# Patient Record
Sex: Male | Born: 1990 | Race: Black or African American | Hispanic: No | Marital: Married | Smoking: Current every day smoker
Health system: Southern US, Community
[De-identification: ages and names within clinical notes are randomized; demographics above are authoritative.]

## PROBLEM LIST (undated history)

## (undated) DIAGNOSIS — J45909 Unspecified asthma, uncomplicated: Secondary | ICD-10-CM

## (undated) HISTORY — PX: NO PAST SURGERIES: SHX2092

## (undated) HISTORY — DX: Unspecified asthma, uncomplicated: J45.909

---

## 2018-10-05 ENCOUNTER — Encounter (HOSPITAL_COMMUNITY): Payer: Self-pay | Admitting: *Deleted

## 2018-10-05 ENCOUNTER — Emergency Department (HOSPITAL_COMMUNITY): Payer: No Typology Code available for payment source

## 2018-10-05 ENCOUNTER — Other Ambulatory Visit: Payer: Self-pay

## 2018-10-05 ENCOUNTER — Emergency Department (HOSPITAL_COMMUNITY)
Admission: EM | Admit: 2018-10-05 | Discharge: 2018-10-05 | Disposition: A | Discharge status: Home / Self Care | Payer: No Typology Code available for payment source | Attending: Emergency Medicine | Admitting: Emergency Medicine

## 2018-10-05 DIAGNOSIS — F17210 Nicotine dependence, cigarettes, uncomplicated: Secondary | ICD-10-CM | POA: Insufficient documentation

## 2018-10-05 DIAGNOSIS — M545 Low back pain, unspecified: Secondary | ICD-10-CM

## 2018-10-05 MED ORDER — METHOCARBAMOL 750 MG PO TABS: 750.0000 mg | ORAL_TABLET | Freq: Three times a day (TID) | ORAL | 0 refills | Status: DC | PRN

## 2018-10-05 NOTE — Discharge Instructions (Addendum)
The x-rays on your lower back did not show any broken bones.  Please take Ibuprofen (Advil, motrin) and Tylenol (acetaminophen) to relieve your pain.  You may take up to 600 MG (3 pills) of normal strength ibuprofen every 8 hours as needed.  In between doses of ibuprofen you make take tylenol, up to 1,000 mg (two extra strength pills).  Do not take more than 3,000 mg tylenol in a 24 hour period.  Please check all medication labels as many medications such as pain and cold medications may contain tylenol.  Do not drink alcohol while taking these medications.  Do not take other NSAID'S while taking ibuprofen (such as aleve or naproxen).  Please take ibuprofen with food to decrease stomach upset.  You are being prescribed a medication which may make you sleepy. For 24 hours after one dose please do not drive, operate heavy machinery, care for a small child with out another adult present, or perform any activities that may cause harm to you or someone else if you were to fall asleep or be impaired.   The best way to get rid of muscle pain is by taking NSAIDS, using heat, massage therapy, and gentle stretching/range of motion exercises.
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
## 2018-10-05 NOTE — ED Notes (Signed)
Bed: WA20 Expected date:  Expected time:  Means of arrival:  Comments: 
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
## 2019-12-13 ENCOUNTER — Ambulatory Visit
Admission: EM | Admit: 2019-12-13 | Discharge: 2019-12-13 | Disposition: A | Discharge status: Home / Self Care | Payer: Self-pay | Attending: Family Medicine | Admitting: Family Medicine

## 2019-12-13 ENCOUNTER — Encounter: Payer: Self-pay | Admitting: Emergency Medicine

## 2019-12-13 ENCOUNTER — Other Ambulatory Visit: Payer: Self-pay

## 2019-12-13 DIAGNOSIS — R05 Cough: Secondary | ICD-10-CM | POA: Insufficient documentation

## 2019-12-13 DIAGNOSIS — B356 Tinea cruris: Secondary | ICD-10-CM | POA: Insufficient documentation

## 2019-12-13 DIAGNOSIS — F17210 Nicotine dependence, cigarettes, uncomplicated: Secondary | ICD-10-CM | POA: Insufficient documentation

## 2019-12-13 DIAGNOSIS — Z20822 Contact with and (suspected) exposure to covid-19: Secondary | ICD-10-CM | POA: Insufficient documentation

## 2019-12-13 DIAGNOSIS — J029 Acute pharyngitis, unspecified: Secondary | ICD-10-CM | POA: Insufficient documentation

## 2019-12-13 DIAGNOSIS — Z79899 Other long term (current) drug therapy: Secondary | ICD-10-CM | POA: Insufficient documentation

## 2019-12-13 DIAGNOSIS — R062 Wheezing: Secondary | ICD-10-CM | POA: Insufficient documentation

## 2019-12-13 DIAGNOSIS — J069 Acute upper respiratory infection, unspecified: Secondary | ICD-10-CM | POA: Insufficient documentation

## 2019-12-13 LAB — CHLAMYDIA/NGC RT PCR (ARMC ONLY)
Chlamydia Tr: NOT DETECTED
N gonorrhoeae: NOT DETECTED

## 2019-12-13 LAB — GROUP A STREP BY PCR: Group A Strep by PCR: NOT DETECTED

## 2019-12-13 MED ORDER — ALBUTEROL SULFATE HFA 108 (90 BASE) MCG/ACT IN AERS: 1.0000 | INHALATION_SPRAY | Freq: Four times a day (QID) | RESPIRATORY_TRACT | 0 refills | Status: DC | PRN

## 2019-12-13 MED ORDER — FLUCONAZOLE 150 MG PO TABS: ORAL_TABLET | ORAL | 0 refills | Status: DC

## 2019-12-13 NOTE — ED Provider Notes (Signed)
[facility]    CSN: [id number] Arrival date & time: 12/13/19  1417      History   Chief Complaint Chief Complaint  Patient presents with  . Cough  . Wheezing  . SEXUALLY TRANSMITTED DISEASE  . Sore Throat    HPI Jorge Wolfe is a 29 y.o. male.   29 yo male with a c/o cough, wheezing and nasal congestion since yesterday. Denies any fevers, chills, chest pains, shortness of breath.   Also c/o itchy rash to his right scrotum. Denies any discharge. Patient requesting STD testing.    Cough Associated symptoms: wheezing   Wheezing Associated symptoms: cough   Sore Throat    History reviewed. No pertinent past medical history.  There are no problems to display for this patient.   Past Surgical History:  Procedure Laterality Date  . NO PAST SURGERIES         Home Medications    Prior to Admission medications   Medication Sig Start Date End Date Taking? Authorizing Provider  albuterol (VENTOLIN HFA) 108 (90 Base) MCG/ACT inhaler Inhale 1-2 puffs into the lungs every 6 (six) hours as needed for wheezing or shortness of breath. 12/13/19   Payton Mccallum, MD  fluconazole (DIFLUCAN) 150 MG tablet 1 tablet po once, then repeat in 1 week 12/13/19   Payton Mccallum, MD  methocarbamol (ROBAXIN) 750 MG tablet Take 1-2 tablets (750-1,500 mg total) by mouth 3 (three) times daily as needed for muscle spasms. 10/05/18   Cristina Gong, PA-C    Family History Family History  Problem Relation Age of Onset  . Hypertension Mother   . Arrhythmia Father   . Hypertension Father   . Hyperlipidemia Father     Social History Social History   Tobacco Use  . Smoking status: Current Every Day Smoker    Packs/day: 0.00    Years: 8.00    Pack years: 0.00    Types: Cigars  . Smokeless tobacco: Never Used  . Tobacco comment: 2 cigars per day  Substance Use Topics  . Alcohol use: Yes    Alcohol/week: 6.0 standard drinks    Types: 6 Standard drinks or equivalent per  week    Comment: beer and liquor  . Drug use: Never     Allergies   Patient has no known allergies.   Review of Systems Review of Systems  Respiratory: Positive for cough and wheezing.      Physical Exam Triage Vital Signs ED Triage Vitals  Enc Vitals Group     BP 12/13/19 1429 131/82     Pulse Rate 12/13/19 1429 93     Resp 12/13/19 1429 18     Temp 12/13/19 1429 99.7 F (37.6 C)     Temp Source 12/13/19 1429 Oral     SpO2 12/13/19 1429 97 %     Weight 12/13/19 1432 250 lb (113.4 kg)     Height 12/13/19 1432 5\' 9"  (1.753 m)     Head Circumference --      Peak Flow --      Pain Score 12/13/19 1430 6     Pain Loc --      Pain Edu? --      Excl. in GC? --    No data found.  Updated Vital Signs BP 131/82 (BP Location: Left Arm)   Pulse 93   Temp 99.7 F (37.6 C) (Oral)   Resp 18   Ht 5\' 9"  (1.753 m)   Wt 113.4  kg   SpO2 97%   BMI 36.92 kg/m   Visual Acuity Right Eye Distance:   Left Eye Distance:   Bilateral Distance:    Right Eye Near:   Left Eye Near:    Bilateral Near:     Physical Exam Vitals and nursing note reviewed.  Constitutional:      General: He is not in acute distress.    Appearance: He is not toxic-appearing or diaphoretic.  HENT:     Right Ear: Tympanic membrane normal.     Left Ear: Tympanic membrane normal.     Mouth/Throat:     Pharynx: Posterior oropharyngeal erythema present. No oropharyngeal exudate.  Cardiovascular:     Heart sounds: Normal heart sounds.  Pulmonary:     Effort: Pulmonary effort is normal. No respiratory distress.     Breath sounds: No stridor. Wheezing (few, mild) present. No rhonchi or rales.  Genitourinary:    Comments: Right scrotal erythematous, scaly rash Neurological:     Mental Status: He is alert.      UC Treatments / Results  Labs (all labs ordered are listed, but only abnormal results are displayed) Labs Reviewed  GROUP A STREP BY PCR  SARS CORONAVIRUS 2 (TAT 6-24 HRS)  CHLAMYDIA/NGC  RT PCR [facility] ONLY)    EKG   Radiology No results found.  Procedures Procedures (including critical care time)  Medications Ordered in UC Medications - No data to display  Initial Impression / Assessment and Plan / UC Course  I have reviewed the triage vital signs and the nursing notes.  Pertinent labs & imaging results that were available during my care of the patient were reviewed by me and considered in my medical decision making (see chart for details).     Final Clinical Impressions(s) / UC Diagnoses   Final diagnoses:  Viral URI with cough  Wheezing  Tinea cruris     Discharge Instructions     Over the counter antifungal ("jock itch") powder two times per day    ED Prescriptions    Medication Sig Dispense Auth. Provider   albuterol (VENTOLIN HFA) 108 (90 Base) MCG/ACT inhaler Inhale 1-2 puffs into the lungs every 6 (six) hours as needed for wheezing or shortness of breath. 8 g Norval Gable, MD   fluconazole (DIFLUCAN) 150 MG tablet 1 tablet po once, then repeat in 1 week 2 tablet Norval Gable, MD     1. diagnosis reviewed with patient 2. rx as per orders above; reviewed possible side effects, interactions, risks and benefits  3. Recommend supportive treatment as above 4. Follow-up prn if symptoms worsen or don't improve  PDMP not reviewed this encounter.   Norval Gable, MD 12/13/19 [phone]

## 2019-12-13 NOTE — Discharge Instructions (Signed)
Over the counter antifungal ("jock itch") powder two times per day

## 2019-12-13 NOTE — ED Triage Notes (Signed)
Patient also states that he has a sore throat.

## 2019-12-13 NOTE — ED Triage Notes (Signed)
Patient in today c/o cough and wheezing x 1 day. Patient denies fever or any other symptoms. Patient has not used any OTC medications.  Patient also requesting STD testing. He states he has itching in the scrotum area x 2 months. Patient denies penile discharge. Patient has used coconut oil to the scrotal area.

## 2019-12-14 LAB — SARS CORONAVIRUS 2 (TAT 6-24 HRS): SARS Coronavirus 2: NEGATIVE
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
## 2023-06-25 ENCOUNTER — Ambulatory Visit: Payer: BC Managed Care – PPO | Admitting: Nurse Practitioner

## 2023-06-25 ENCOUNTER — Encounter: Payer: Self-pay | Admitting: Nurse Practitioner

## 2023-06-25 VITALS — BP 120/80 | HR 78 | Temp 98.5°F | Ht 69.0 in | Wt 288.2 lb

## 2023-06-25 DIAGNOSIS — Z23 Encounter for immunization: Secondary | ICD-10-CM | POA: Diagnosis not present

## 2023-06-25 DIAGNOSIS — Z1159 Encounter for screening for other viral diseases: Secondary | ICD-10-CM | POA: Diagnosis not present

## 2023-06-25 DIAGNOSIS — Z13228 Encounter for screening for other metabolic disorders: Secondary | ICD-10-CM

## 2023-06-25 DIAGNOSIS — Z8709 Personal history of other diseases of the respiratory system: Secondary | ICD-10-CM | POA: Insufficient documentation

## 2023-06-25 DIAGNOSIS — Z113 Encounter for screening for infections with a predominantly sexual mode of transmission: Secondary | ICD-10-CM | POA: Diagnosis not present

## 2023-06-25 DIAGNOSIS — Z13220 Encounter for screening for lipoid disorders: Secondary | ICD-10-CM

## 2023-06-25 DIAGNOSIS — R062 Wheezing: Secondary | ICD-10-CM

## 2023-06-25 DIAGNOSIS — Z7689 Persons encountering health services in other specified circumstances: Secondary | ICD-10-CM

## 2023-06-25 DIAGNOSIS — R0683 Snoring: Secondary | ICD-10-CM

## 2023-06-25 DIAGNOSIS — Z8669 Personal history of other diseases of the nervous system and sense organs: Secondary | ICD-10-CM | POA: Insufficient documentation

## 2023-06-25 DIAGNOSIS — Z2821 Immunization not carried out because of patient refusal: Secondary | ICD-10-CM

## 2023-06-25 MED ORDER — ALBUTEROL SULFATE HFA 108 (90 BASE) MCG/ACT IN AERS: 1.0000 | INHALATION_SPRAY | Freq: Four times a day (QID) | RESPIRATORY_TRACT | 0 refills | Status: DC | PRN

## 2023-06-25 NOTE — Assessment & Plan Note (Signed)
Will check Hepatitis C screening due to recent recommendations to screen all adults 18 years and older

## 2023-06-25 NOTE — Progress Notes (Signed)
[name], CMA,acting as a Neurosurgeon for Jorge Felts, FNP.,have documented all relevant documentation on the behalf of Jorge Felts, FNP,as directed by  Jorge Felts, FNP while in the presence of Jorge Felts, FNP.  Subjective:  Patient ID: Jorge Wolfe , male    DOB: 1990/08/04 , 32 y.o.   MRN: [id number]  Chief Complaint  Patient presents with   Establish Care    HPI  Patient presents today to establish care. He works with towing and Retail banker. Married. No children.   PMH - Asthma as a child - uses an inhaler when needed.  Sometimes he is wheezing and shortness of breath. He was using an albuterol inhaler. He was using when he played sports as a child would use more.    Patient reports he believes he needs a CPAP machine, patient reports he had many sleep studies.  He was told he had sleep apnea.  He states doing a at home sleep study about a year ago. 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 He is unable to tell if he is having fatigue. He does report snoring.  He never received the report from the sleep study. [facility]  Patient reports he has skin blotches all over his shoulders, back, and chest. Patient states it is sometimes itchy but it is sometimes flaky and dry. He has not treated with any creams other than with lotion.      Past Medical History:  Diagnosis Date   Asthma      Family History  Problem Relation Age of Onset   Hypertension Mother    Arthritis Father    Heart disease Father    Arrhythmia Father    Hypertension Father    Hyperlipidemia Father      Current Outpatient Medications:    albuterol (VENTOLIN HFA) 108 (90 Base) MCG/ACT inhaler, Inhale 1-2 puffs into the lungs every 6 (six) hours as needed for wheezing or shortness of breath., Disp: 8 g, Rfl: 0   No Known Allergies   Review of Systems  Constitutional: Negative.    HENT:         Snoring  Respiratory: Negative.    Cardiovascular: Negative.   Gastrointestinal:  Negative for nausea.  Musculoskeletal: Negative.   Neurological: Negative.   Psychiatric/Behavioral: Negative.       Today's Vitals   06/25/23 1055  BP: 120/80  Pulse: 78  Temp: 98.5 F (36.9 C)  TempSrc: Oral  Height: 5\' 9"  (1.753 m)  PainSc: 0-No pain   Body mass index is 36.92 kg/m.  Wt Readings from Last 3 Encounters:  12/13/19 250 lb (113.4 kg)  10/05/18 235 lb (106.6 kg)    The ASCVD Risk score (Arnett DK, et al., 2019) failed to calculate for the following reasons:   The 2019 ASCVD risk score is only valid for ages 16 to 26  Objective:  Physical Exam Vitals reviewed.  Constitutional:      General: He is not in acute distress.    Appearance: Normal appearance. He is obese.  Cardiovascular:     Rate and Rhythm: Normal rate and regular rhythm.     Pulses: Normal pulses.     Heart sounds: Normal heart sounds. No murmur heard. Pulmonary:     Effort: Pulmonary effort is normal. No respiratory distress.     Breath sounds:  Normal breath sounds. No wheezing.  Skin:    General: Skin is warm and dry.     Capillary Refill: Capillary refill takes less than 2 seconds.  Neurological:     General: No focal deficit present.     Mental Status: He is alert and oriented to person, place, and time.     Cranial Nerves: No cranial nerve deficit.     Motor: No weakness.  Psychiatric:        Mood and Affect: Mood normal.        Behavior: Behavior normal.        Thought Content: Thought content normal.        Judgment: Judgment normal.         Assessment And Plan:  Establishing care with new doctor, encounter for Assessment & Plan: Patient is here to establish care. Went over patient medical, family, social and surgical history. Reviewed with patient their medications and any allergies  Reviewed with patient their sexual orientation, drug/tobacco and alcohol use Dicussed  any new concerns with patient  recommended patient comes in for a physical exam and complete blood work.  Educated patient about the importance of annual screenings and immunizations.  Advised patient to eat a healthy diet along with exercise for atleast 30-45 min atleast 4-5 days of the week.     Snoring Assessment & Plan: His wife showed a video of him snoring loudly, will refer to [facility] for sleep evaluation. He was to have a CPAP but did not get due to cost did not have insurance. Wife reports stopped breathing 60 times, I do not have access to his results. Discussed risk of sleep apnea to include cardiac events, hyperglycemia, hyperlipidemia and obesity  Orders: -     Ambulatory referral to Sleep Studies  Wheezing Assessment & Plan: No wheezing on physical exam however due to history of asthma as a child will send rx for albuterol if has to use more than 3 times a week needs to call to office for an appt.   Orders: -     Albuterol Sulfate HFA; Inhale 1-2 puffs into the lungs every 6 (six) hours as needed for wheezing or shortness of breath.  Dispense: 8 g; Refill: 0 -     DG Chest 2 View; Future  Need for influenza vaccination Assessment & Plan: Influenza vaccine administered Encouraged to take Tylenol as needed for fever or muscle aches.   Orders: -     Flu vaccine trivalent PF, 6mos and older(Flulaval,Afluria,Fluarix,Fluzone)  Need for Tdap vaccination Assessment & Plan: Will give tetanus vaccine today while in office. Refer to order management. TDAP will be administered to adults 74-10 years old every 10 years.   Orders: -     Tdap vaccine greater than or equal to 7yo IM  Screening for STDs (sexually transmitted diseases) -     HIV Antibody (routine testing w rflx) -     RPR -     HSV 1 and 2 Ab, IgG -     Chlamydia/Gonococcus/Trichomonas, NAA -     Hepatitis B surface antibody,qualitative -     T pallidum Screening [facility]  COVID-19 vaccination  declined Assessment & Plan: Declines covid 19 vaccine. Discussed risk of covid 14 and if he changes her mind about the vaccine to call the office. Education has been provided regarding the importance of this vaccine but patient still declined. Advised may receive this vaccine at local pharmacy or Health Dept.or vaccine clinic. Aware to provide  a copy of the vaccination record if obtained from local pharmacy or Health Dept.  Encouraged to take multivitamin, vitamin d, vitamin c and zinc to increase immune system. Aware can call office if would like to have vaccine here at office. Verbalized acceptance and understanding.    Encounter for hepatitis C screening test for low risk patient Assessment & Plan: Will check Hepatitis C screening due to recent recommendations to screen all adults 18 years and older   Orders: -     Hepatitis C antibody  History of sleep apnea Assessment & Plan: We will start fresh as he did a home sleep study that he paid out of pocket and does not have the results. Will refer to [facility] for evaluation. Wife reports he stopped breathing 60 times.    Orders: -     TSH  Encounter for screening for metabolic disorder -     CMP14 + Anion Gap -     Hemoglobin A1c  Encounter for screening for lipid disorder -     Lipid panel  History of asthma Assessment & Plan: Per patient diagnosed as a child    Return for labs in am and HM in February 2025.  Patient was given opportunity to ask questions. Patient verbalized understanding of the plan and was able to repeat key elements of the plan. All questions were answered to their satisfaction.    Jeanell Sparrow, FNP, have reviewed all documentation for this visit. The documentation on 06/25/23 for the exam, diagnosis, procedures, and orders are all accurate and complete.   IF YOU HAVE BEEN REFERRED TO A SPECIALIST, IT MAY TAKE 1-2 WEEKS TO SCHEDULE/PROCESS THE REFERRAL. IF YOU HAVE NOT HEARD FROM US/SPECIALIST IN TWO WEEKS,  PLEASE GIVE [place] A CALL AT [phone].

## 2023-06-25 NOTE — Assessment & Plan Note (Signed)
We will start fresh as he did a home sleep study that he paid out of pocket and does not have the results. Will refer to [facility] for evaluation. Wife reports he stopped breathing 60 times.

## 2023-06-25 NOTE — Assessment & Plan Note (Signed)
Influenza vaccine administered Encouraged to take Tylenol as needed for fever or muscle aches.

## 2023-06-25 NOTE — Assessment & Plan Note (Signed)
Will give tetanus vaccine today while in office. Refer to order management. TDAP will be administered to adults 79-31 years old every 10 years.

## 2023-06-25 NOTE — Patient Instructions (Addendum)
He can using selsun blue  [facility] [phone] If you have to use your albuterol inhaler more than 3 times a week call to office.

## 2023-06-25 NOTE — Assessment & Plan Note (Addendum)
His wife showed a video of him snoring loudly, will refer to [facility] for sleep evaluation. He was to have a CPAP but did not get due to cost did not have insurance. Wife reports stopped breathing 60 times, I do not have access to his results. Discussed risk of sleep apnea to include cardiac events, hyperglycemia, hyperlipidemia and obesity

## 2023-06-25 NOTE — Assessment & Plan Note (Signed)
Per patient diagnosed as a child

## 2023-06-25 NOTE — Assessment & Plan Note (Signed)

## 2023-06-25 NOTE — Assessment & Plan Note (Signed)
Declines covid 19 vaccine. Discussed risk of covid 44 and if he changes her mind about the vaccine to call the office. Education has been provided regarding the importance of this vaccine but patient still declined. Advised may receive this vaccine at local pharmacy or Health Dept.or vaccine clinic. Aware to provide a copy of the vaccination record if obtained from local pharmacy or Health Dept.  Encouraged to take multivitamin, vitamin d, vitamin c and zinc to increase immune system. Aware can call office if would like to have vaccine here at office. Verbalized acceptance and understanding.

## 2023-06-25 NOTE — Assessment & Plan Note (Signed)
No wheezing on physical exam however due to history of asthma as a child will send rx for albuterol if has to use more than 3 times a week needs to call to office for an appt.

## 2023-06-26 ENCOUNTER — Other Ambulatory Visit: Payer: BC Managed Care – PPO

## 2023-06-26 LAB — CHLAMYDIA/GONOCOCCUS/TRICHOMONAS, NAA
Chlamydia by NAA: NEGATIVE
Gonococcus by NAA: NEGATIVE
Trich vag by NAA: NEGATIVE

## 2023-07-01 ENCOUNTER — Other Ambulatory Visit: Payer: BC Managed Care – PPO

## 2023-07-02 LAB — CMP14 + ANION GAP
ALT: 17 [IU]/L (ref 0–44)
AST: 18 [IU]/L (ref 0–40)
Albumin: 4.5 g/dL (ref 4.1–5.1)
Alkaline Phosphatase: 68 [IU]/L (ref 44–121)
Anion Gap: 15 mmol/L (ref 10.0–18.0)
BUN/Creatinine Ratio: 9 (ref 9–20)
BUN: 10 mg/dL (ref 6–20)
Bilirubin Total: 0.6 mg/dL (ref 0.0–1.2)
CO2: 26 mmol/L (ref 20–29)
Calcium: 9.7 mg/dL (ref 8.7–10.2)
Chloride: 99 mmol/L (ref 96–106)
Creatinine, Ser: 1.11 mg/dL (ref 0.76–1.27)
Globulin, Total: 2.6 g/dL (ref 1.5–4.5)
Glucose: 93 mg/dL (ref 70–99)
Potassium: 4.2 mmol/L (ref 3.5–5.2)
Sodium: 140 mmol/L (ref 134–144)
Total Protein: 7.1 g/dL (ref 6.0–8.5)
eGFR: 91 mL/min/{1.73_m2} (ref >59)

## 2023-07-02 LAB — LIPID PANEL
Chol/HDL Ratio: 5.3 {ratio} — ABNORMAL HIGH (ref 0.0–5.0)
Cholesterol, Total: 223 mg/dL — ABNORMAL HIGH (ref 100–199)
HDL: 42 mg/dL (ref >39)
LDL Chol Calc (NIH): 163 mg/dL — ABNORMAL HIGH (ref 0–99)
Triglycerides: 98 mg/dL (ref 0–149)
VLDL Cholesterol Cal: 18 mg/dL (ref 5–40)

## 2023-07-02 LAB — T PALLIDUM SCREENING CASCADE: T pallidum Antibodies (TP-PA): NONREACTIVE

## 2023-07-02 LAB — HSV 1 AND 2 AB, IGG
HSV 1 Glycoprotein G Ab, IgG: REACTIVE — AB
HSV 2 IgG, Type Spec: NONREACTIVE

## 2023-07-02 LAB — HEPATITIS C ANTIBODY: Hep C Virus Ab: NONREACTIVE

## 2023-07-02 LAB — RPR: RPR Ser Ql: NONREACTIVE

## 2023-07-02 LAB — HEMOGLOBIN A1C
Est. average glucose Bld gHb Est-mCnc: 108 mg/dL
Hgb A1c MFr Bld: 5.4 % (ref 4.8–5.6)

## 2023-07-02 LAB — HIV ANTIBODY (ROUTINE TESTING W REFLEX): HIV Screen 4th Generation wRfx: NONREACTIVE

## 2023-07-02 LAB — TSH: TSH: 1.73 u[IU]/mL (ref 0.450–4.500)

## 2023-07-02 LAB — HEPATITIS B SURFACE ANTIBODY,QUALITATIVE: Hep B Surface Ab, Qual: NONREACTIVE

## 2023-07-17 ENCOUNTER — Other Ambulatory Visit: Payer: Self-pay | Admitting: Nurse Practitioner

## 2023-07-17 DIAGNOSIS — R062 Wheezing: Secondary | ICD-10-CM

## 2023-09-26 ENCOUNTER — Encounter: Payer: Self-pay | Admitting: Nurse Practitioner

## 2023-09-26 ENCOUNTER — Ambulatory Visit (INDEPENDENT_AMBULATORY_CARE_PROVIDER_SITE_OTHER): Payer: BC Managed Care – PPO | Admitting: Nurse Practitioner

## 2023-09-26 VITALS — BP 110/70 | HR 82 | Temp 97.8°F | Ht 69.0 in | Wt 290.2 lb

## 2023-09-26 DIAGNOSIS — Z8709 Personal history of other diseases of the respiratory system: Secondary | ICD-10-CM | POA: Diagnosis not present

## 2023-09-26 DIAGNOSIS — R21 Rash and other nonspecific skin eruption: Secondary | ICD-10-CM | POA: Diagnosis not present

## 2023-09-26 DIAGNOSIS — Z0000 Encounter for general adult medical examination without abnormal findings: Secondary | ICD-10-CM | POA: Insufficient documentation

## 2023-09-26 DIAGNOSIS — Z8669 Personal history of other diseases of the nervous system and sense organs: Secondary | ICD-10-CM

## 2023-09-26 DIAGNOSIS — Z2821 Immunization not carried out because of patient refusal: Secondary | ICD-10-CM

## 2023-09-26 MED ORDER — MOMETASONE FUROATE 0.1 % EX CREA: 1.0000 | TOPICAL_CREAM | Freq: Every day | CUTANEOUS | 1 refills | Status: DC

## 2023-09-26 NOTE — Progress Notes (Signed)
 [name], CMA,acting as a Neurosurgeon for [name], FNP.,have documented all relevant documentation on the behalf of [name], FNP,as directed by  [name], FNP while in the presence of [name], FNP.  Subjective:   Patient ID: Jorge Wolfe , male    DOB: 1991/02/27 , 33 y.o.   MRN: [id number]  Chief Complaint  Patient presents with   Annual Exam    HPI  Patient presents today for HM, Patient reports compliance with medication. Patient denies any chest pain, SOB, or headaches. Patient reports he didn't do his sleep study, sleep study was denied by insurance stating he has had one in the last 2 years. Patient does not have the results from previous sleep study.  Patient reports sometimes he has irration around his penis he thinks it may be eczema or dry skin. He has not applied any creams or ointment. Does not interfere with intercourse.      Past Medical History:  Diagnosis Date   Asthma      Family History  Problem Relation Age of Onset   Hypertension Mother    Arthritis Father    Heart disease Father    Arrhythmia Father    Hypertension Father    Hyperlipidemia Father      Current Outpatient Medications:    albuterol (VENTOLIN HFA) 108 (90 Base) MCG/ACT inhaler, INHALE 1-2 PUFFS BY MOUTH EVERY 6 HOURS AS NEEDED FOR WHEEZE OR SHORTNESS OF BREATH, Disp: 18 each, Rfl: 1   mometasone (ELOCON) 0.1 % cream, Apply 1 Application topically daily., Disp: 45 g, Rfl: 1   No Known Allergies   Men's preventive visit. Patient Health Questionnaire (PHQ-2) is  Flowsheet Row Office Visit from 09/26/2023 in [facility]  PHQ-2 Total Score 0     Patient is on a Regular diet; fast food mostly.  Exercising - none unless it is work with [employer]. Marital status: Married. Relevant history for alcohol use is:  Social History   Substance and Sexual Activity  Alcohol Use Not Currently   Alcohol/week: 10.0 standard drinks of alcohol   Types: 10  Cans of beer per week   Comment: beer and liquor- occ; 09/26/23 - couple drinks a week   Relevant history for tobacco use is:  Social History   Tobacco Use  Smoking Status Every Day   Current packs/day: 0.50   Average packs/day: 0.5 packs/day for 11.2 years (5.6 ttl pk-yrs)   Types: Cigars, Cigarettes   Start date: 2014  Smokeless Tobacco Never  Tobacco Comments   1/2 PPD - 06/25/2023  .   Review of Systems  Constitutional: Negative.   HENT: Negative.         Snoring  Eyes: Negative.   Respiratory: Negative.    Cardiovascular: Negative.   Gastrointestinal: Negative.  Negative for nausea.  Endocrine: Negative.   Genitourinary: Negative.   Musculoskeletal: Negative.   Skin:  Positive for rash (scrotal area).  Allergic/Immunologic: Negative.   Neurological: Negative.   Hematological: Negative.   Psychiatric/Behavioral: Negative.       Today's Vitals   09/26/23 1029  BP: 110/70  Pulse: 82  Temp: 97.8 F (36.6 C)  TempSrc: Oral  Weight: 290 lb 3.2 oz (131.6 kg)  Height: 5\' 9"  (1.753 m)  PainSc: 0-No pain   Body mass index is 42.86 kg/m.  Wt Readings from Last 3 Encounters:  09/26/23 290 lb 3.2 oz (131.6 kg)  06/25/23 288 lb 3.2 oz (130.7 kg)  12/13/19 250  lb (113.4 kg)    Objective:  Physical Exam Vitals reviewed.  Constitutional:      General: He is not in acute distress.    Appearance: Normal appearance. He is obese.  HENT:     Head: Normocephalic and atraumatic.     Right Ear: Tympanic membrane, ear canal and external ear normal. There is no impacted cerumen.     Left Ear: Tympanic membrane, ear canal and external ear normal. There is no impacted cerumen.     Nose: Nose normal.     Mouth/Throat:     Mouth: Mucous membranes are moist.  Eyes:     Pupils: Pupils are equal, round, and reactive to light.  Cardiovascular:     Rate and Rhythm: Normal rate and regular rhythm.     Pulses: Normal pulses.     Heart sounds: Normal heart sounds. No murmur  heard. Pulmonary:     Effort: Pulmonary effort is normal. No respiratory distress.     Breath sounds: Normal breath sounds.  Abdominal:     General: Abdomen is flat. Bowel sounds are normal. There is no distension.     Palpations: Abdomen is soft.  Genitourinary:    Prostate: Normal.     Rectum: Guaiac result negative.  Musculoskeletal:        General: Normal range of motion.     Cervical back: Normal range of motion and neck supple.  Skin:    General: Skin is warm.     Capillary Refill: Capillary refill takes less than 2 seconds.  Neurological:     General: No focal deficit present.     Mental Status: He is alert and oriented to person, place, and time.     Cranial Nerves: No cranial nerve deficit.     Motor: No weakness.  Psychiatric:        Mood and Affect: Mood normal.        Behavior: Behavior normal.        Thought Content: Thought content normal.        Judgment: Judgment normal.         Assessment And Plan:    Encounter for annual health examination Assessment & Plan: Behavior modifications discussed and diet history reviewed.   Pt will continue to exercise regularly and modify diet with low GI, plant based foods and decrease intake of processed foods.  Recommend intake of daily multivitamin, Vitamin D, and calcium.  Recommend colonoscopy for preventive screenings, as well as recommend immunizations that include influenza, TDAP    History of sleep apnea Assessment & Plan: Unfortunately [facility] will not repeat his sleep study since done recently. He is to try to contact the company he had the sleep study so we can get the results.      COVID-19 vaccination declined Assessment & Plan: Declines covid 19 vaccine. Discussed risk of covid 1 and if he changes her mind about the vaccine to call the office. Education has been provided regarding the importance of this vaccine but patient still declined. Advised may receive this vaccine at local pharmacy or Health Dept.or  vaccine clinic. Aware to provide a copy of the vaccination record if obtained from local pharmacy or Health Dept.  Encouraged to take multivitamin, vitamin d, vitamin c and zinc to increase immune system. Aware can call office if would like to have vaccine here at office. Verbalized acceptance and understanding.    History of asthma  Pneumococcal vaccination declined  Obesity, morbid (HCC) Assessment & Plan: he  is encouraged to strive for BMI less than 30 to decrease cardiac risk. Advised to aim for at least 150 minutes of exercise per week.    Scrotal rash Assessment & Plan: Scaly, dry rash to scrotal area. Will treat with steroid cream. Advised to keep area dry.   Orders: -     Mometasone Furoate; Apply 1 Application topically daily.  Dispense: 45 g; Refill: 1    I, [name], FNP, have reviewed all documentation for this visit. The documentation on 10/10/23 for the exam, diagnosis, procedures, and orders are all accurate and complete.   Jeanell Sparrow, FNP, have reviewed all documentation for this visit. The documentation on 09/26/23 for the exam, diagnosis, procedures, and orders are all accurate and complete.   Return for 1 year physical; 4 month cholesterol f/u. Patient was given opportunity to ask questions. Patient verbalized understanding of the plan and was able to repeat key elements of the plan. All questions were answered to their satisfaction.   [name], FNP

## 2023-09-26 NOTE — Patient Instructions (Signed)
 Gold bond powder antifungal to scrotal area.

## 2023-10-10 NOTE — Assessment & Plan Note (Signed)
 Scaly, dry rash to scrotal area. Will treat with steroid cream. Advised to keep area dry.

## 2023-10-10 NOTE — Assessment & Plan Note (Signed)

## 2023-10-10 NOTE — Assessment & Plan Note (Signed)
 he is encouraged to strive for BMI less than 30 to decrease cardiac risk. Advised to aim for at least 150 minutes of exercise per week.

## 2023-10-10 NOTE — Assessment & Plan Note (Signed)
 Behavior modifications discussed and diet history reviewed.   Pt will continue to exercise regularly and modify diet with low GI, plant based foods and decrease intake of processed foods.  Recommend intake of daily multivitamin, Vitamin D, and calcium.  Recommend colonoscopy for preventive screenings, as well as recommend immunizations that include influenza, TDAP

## 2023-10-10 NOTE — Assessment & Plan Note (Signed)
 Unfortunately [facility] will not repeat his sleep study since done recently. He is to try to contact the company he had the sleep study so we can get the results.

## 2023-10-18 ENCOUNTER — Telehealth: Payer: Self-pay

## 2023-10-18 NOTE — Telephone Encounter (Signed)
 [employer] confirmed they received paperwork on patient.

## 2024-01-23 ENCOUNTER — Ambulatory Visit: Payer: BC Managed Care – PPO | Admitting: Nurse Practitioner

## 2024-01-23 ENCOUNTER — Encounter: Payer: Self-pay | Admitting: Nurse Practitioner

## 2024-01-23 VITALS — BP 146/82 | HR 90 | Temp 98.2°F | Ht 69.0 in | Wt 290.0 lb

## 2024-01-23 DIAGNOSIS — E785 Hyperlipidemia, unspecified: Secondary | ICD-10-CM

## 2024-01-23 DIAGNOSIS — Z114 Encounter for screening for human immunodeficiency virus [HIV]: Secondary | ICD-10-CM

## 2024-01-23 DIAGNOSIS — Z2821 Immunization not carried out because of patient refusal: Secondary | ICD-10-CM

## 2024-01-23 DIAGNOSIS — E782 Mixed hyperlipidemia: Secondary | ICD-10-CM

## 2024-01-23 DIAGNOSIS — Z6841 Body Mass Index (BMI) 40.0 and over, adult: Secondary | ICD-10-CM | POA: Diagnosis not present

## 2024-01-23 DIAGNOSIS — Z113 Encounter for screening for infections with a predominantly sexual mode of transmission: Secondary | ICD-10-CM

## 2024-01-23 DIAGNOSIS — Z1159 Encounter for screening for other viral diseases: Secondary | ICD-10-CM

## 2024-01-23 NOTE — Patient Instructions (Addendum)
 High Cholesterol  High cholesterol is a condition in which the blood has high levels of a white, waxy substance similar to fat (cholesterol). The liver makes all the cholesterol that the body needs. The human body needs small amounts of cholesterol to help build cells. A person gets extra or excess cholesterol from the food that he or she eats. The blood carries cholesterol from the liver to the rest of the body. If you have high cholesterol, deposits (plaques) may build up on the walls of your arteries. Arteries are the blood vessels that carry blood away from your heart. These plaques make the arteries narrow and stiff. Cholesterol plaques increase your risk for heart attack and stroke. Work with your health care provider to keep your cholesterol levels in a healthy range. What increases the risk? The following factors may make you more likely to develop this condition: Eating foods that are high in animal fat (saturated fat) or cholesterol. Being overweight. Not getting enough exercise. A family history of high cholesterol (familial hypercholesterolemia). Use of tobacco products. Having diabetes. What are the signs or symptoms? In most cases, high cholesterol does not usually cause any symptoms. In severe cases, very high cholesterol levels can cause: Fatty bumps under the skin (xanthomas). A white or gray ring around the black center (pupil) of the eye. How is this diagnosed? This condition may be diagnosed based on the results of a blood test. If you are older than 32 years of age, your health care provider may check your cholesterol levels every 4-6 years. You may be checked more often if you have high cholesterol or other risk factors for heart disease. The blood test for cholesterol measures: Bad cholesterol, or LDL cholesterol. This is the main type of cholesterol that causes heart disease. The desired level is less than 100 mg/dL (7.40 mmol/L). Good cholesterol, or HDL  cholesterol. HDL helps protect against heart disease by cleaning the arteries and carrying the LDL to the liver for processing. The desired level for HDL is 60 mg/dL (8.44 mmol/L) or higher. Triglycerides. These are fats that your body can store or burn for energy. The desired level is less than 150 mg/dL (8.30 mmol/L). Total cholesterol. This measures the total amount of cholesterol in your blood and includes LDL, HDL, and triglycerides. The desired level is less than 200 mg/dL (4.82 mmol/L). How is this treated? Treatment for high cholesterol starts with lifestyle changes, such as diet and exercise. Diet changes. You may be asked to eat foods that have more fiber and less saturated fats or added sugar. Lifestyle changes. These may include regular exercise, maintaining a healthy weight, and quitting use of tobacco products. Medicines. These are given when diet and lifestyle changes have not worked. You may be prescribed a statin medicine to help lower your cholesterol levels. Follow these instructions at home: Eating and drinking  Eat a healthy, balanced diet. This diet includes: Daily servings of a variety of fresh, frozen, or canned fruits and vegetables. Daily servings of whole grain foods that are rich in fiber. Foods that are low in saturated fats and trans fats. These include poultry and fish without skin, lean cuts of meat, and low-fat dairy products. A variety of fish, especially oily fish that contain omega-3 fatty acids. Aim to eat fish at least 2 times a week. Avoid foods and drinks that have added sugar. Use healthy cooking methods, such as roasting, grilling, broiling, baking, poaching, steaming, and stir-frying. Do not fry your food except for  stir-frying. If you drink alcohol: Limit how much you have to: 0-1 drink a day for women who are not pregnant. 0-2 drinks a day for men. Know how much alcohol is in a drink. In the [place] one drink equals one 12 oz bottle of beer (355 mL),  one 5 oz glass of wine (148 mL), or one 1 oz glass of hard liquor (44 mL). Lifestyle  Get regular exercise. Aim to exercise for a total of 150 minutes a week. Increase your activity level by doing activities such as gardening, walking, and taking the stairs. Do not use any products that contain nicotine or tobacco. These products include cigarettes, chewing tobacco, and vaping devices, such as e-cigarettes. If you need help quitting, ask your health care provider. General instructions Take over-the-counter and prescription medicines only as told by your health care provider. Keep all follow-up visits. This is important. Where to find more information American Heart Association: www.heart.org National Heart, Lung, and Blood Institute: PopSteam.is Contact a health care provider if: You have trouble achieving or maintaining a healthy diet or weight. You are starting an exercise program. You are unable to stop smoking. Get help right away if: You have chest pain. You have trouble breathing. You have discomfort or pain in your jaw, neck, back, shoulder, or arm. You have any symptoms of a stroke. BE FAST is an easy way to remember the main warning signs of a stroke: B - Balance. Signs are dizziness, sudden trouble walking, or loss of balance. E - Eyes. Signs are trouble seeing or a sudden change in vision. F - Face. Signs are sudden weakness or numbness of the face, or the face or eyelid drooping on one side. A - Arms. Signs are weakness or numbness in an arm. This happens suddenly and usually on one side of the body. S - Speech. Signs are sudden trouble speaking, slurred speech, or trouble understanding what people say. T - Time. Time to call emergency services. Write down what time symptoms started. You have other signs of a stroke, such as: A sudden, severe headache with no known cause. Nausea or vomiting. Seizure. These symptoms may represent a serious problem that is an  emergency. Do not wait to see if the symptoms will go away. Get medical help right away. Call your local emergency services (911 in the [place] Do not drive yourself to the hospital. Summary Cholesterol plaques increase your risk for heart attack and stroke. Work with your health care provider to keep your cholesterol levels in a healthy range. Eat a healthy, balanced diet, get regular exercise, and maintain a healthy weight. Do not use any products that contain nicotine or tobacco. These products include cigarettes, chewing tobacco, and vaping devices, such as e-cigarettes. Get help right away if you have any symptoms of a stroke. This information is not intended to replace advice given to you by your health care provider. Make sure you discuss any questions you have with your health care provider. You can take over the counter red yeast rice supplement to help with cholesterol Document Revised: 02/16/2022 Document Reviewed: 09/19/2020 Elsevier Patient Education  2024 [employer].

## 2024-01-23 NOTE — Progress Notes (Signed)
 I,Jameka J Llittleton, CMA,acting as a Neurosurgeon for [employer], FNP.,have documented all relevant documentation on the behalf of [name], FNP,as directed by  [name], FNP while in the presence of [name], FNP.  Subjective:  Patient ID: Jorge Wolfe , male    DOB: 1990-09-23 , 33 y.o.   MRN: [id number]  Chief Complaint  Patient presents with   Hyperlipidemia    Patient presents today for chol check.    STD screening    Patient stated he would like to get checked    HPI  Hyperlipidemia This is a chronic problem. The current episode started more than 1 month ago. The problem is uncontrolled. Recent lipid tests were reviewed and are high. He has no history of chronic renal disease. Factors aggravating his hyperlipidemia include fatty foods. Pertinent negatives include no chest pain. He is currently on no antihyperlipidemic treatment. The current treatment provides no improvement of lipids. Compliance problems include adherence to diet and adherence to exercise.  Risk factors for coronary artery disease include male sex, obesity and a sedentary lifestyle.     Past Medical History:  Diagnosis Date   Asthma      Family History  Problem Relation Age of Onset   Hypertension Mother    Arthritis Father    Heart disease Father    Arrhythmia Father    Hypertension Father    Hyperlipidemia Father      Current Outpatient Medications:    albuterol  (VENTOLIN  HFA) 108 (90 Base) MCG/ACT inhaler, INHALE 1-2 PUFFS BY MOUTH EVERY 6 HOURS AS NEEDED FOR WHEEZE OR SHORTNESS OF BREATH, Disp: 18 each, Rfl: 1   mometasone  (ELOCON ) 0.1 % cream, Apply 1 Application topically daily. (Patient not taking: Reported on 01/23/2024), Disp: 45 g, Rfl: 1   No Known Allergies   Review of Systems  Constitutional: Negative.   Respiratory: Negative.    Cardiovascular:  Negative for chest pain.  Neurological: Negative.   Psychiatric/Behavioral: Negative.       Today's Vitals   01/23/24 0955  BP: (!)  146/82  Pulse: 90  Temp: 98.2 F (36.8 C)  TempSrc: Oral  Weight: 290 lb (131.5 kg)  Height: 5' 9 (1.753 m)  PainSc: 0-No pain   Body mass index is 42.83 kg/m.  Wt Readings from Last 3 Encounters:  01/23/24 290 lb (131.5 kg)  09/26/23 290 lb 3.2 oz (131.6 kg)  06/25/23 288 lb 3.2 oz (130.7 kg)      Objective:  Physical Exam Vitals and nursing note reviewed.  Constitutional:      General: He is not in acute distress.    Appearance: Normal appearance. He is obese.  Cardiovascular:     Rate and Rhythm: Normal rate and regular rhythm.     Pulses: Normal pulses.     Heart sounds: Normal heart sounds. No murmur heard. Pulmonary:     Effort: Pulmonary effort is normal. No respiratory distress.     Breath sounds: Normal breath sounds. No wheezing.  Skin:    General: Skin is warm and dry.     Capillary Refill: Capillary refill takes less than 2 seconds.  Neurological:     General: No focal deficit present.     Mental Status: He is alert and oriented to person, place, and time.     Cranial Nerves: No cranial nerve deficit.     Motor: No weakness.  Psychiatric:        Mood and Affect: Mood normal.  Behavior: Behavior normal.        Thought Content: Thought content normal.        Judgment: Judgment normal.         Assessment And Plan:  Screening for STDs (sexually transmitted diseases) -     Chlamydia/Gonococcus/Trichomonas, NAA -     RPR -     Hepatitis B surface antigen -     HSV-2 Ab, IgG  Mixed hyperlipidemia Assessment & Plan: Cholesterol levels were elevated, will recheck. Encouraged to eat low fat diet.   Orders: -     Lipid panel  Morbid obesity with BMI of 40.0-44.9, adult [facility]) Assessment & Plan: He is encouraged to strive for BMI less than 30 to decrease cardiac risk. Advised to aim for at least 150 minutes of exercise per week.    Encounter for HIV (human immunodeficiency virus) test -     HIV Antibody (routine testing w rflx)  Encounter  for hepatitis C screening test for low risk patient Assessment & Plan: Will check Hepatitis C screening due to recent recommendations to screen all adults 18 years and older   Orders: -     Hepatitis C antibody  COVID-19 vaccination declined Assessment & Plan: Declines covid 19 vaccine. Discussed risk of covid 11 and if he changes her mind about the vaccine to call the office. Education has been provided regarding the importance of this vaccine but patient still declined. Advised may receive this vaccine at local pharmacy or Health Dept.or vaccine clinic. Aware to provide a copy of the vaccination record if obtained from local pharmacy or Health Dept.  Encouraged to take multivitamin, vitamin d, vitamin c and zinc to increase immune system. Aware can call office if would like to have vaccine here at office. Verbalized acceptance and understanding.      Return for 6 month f/u on chol.  Patient was given opportunity to ask questions. Patient verbalized understanding of the plan and was able to repeat key elements of the plan. All questions were answered to their satisfaction.    LILLETTE [name], FNP, have reviewed all documentation for this visit. The documentation on 01/23/24 for the exam, diagnosis, procedures, and orders are all accurate and complete.   IF YOU HAVE BEEN REFERRED TO A SPECIALIST, IT MAY TAKE 1-2 WEEKS TO SCHEDULE/PROCESS THE REFERRAL. IF YOU HAVE NOT HEARD FROM US /SPECIALIST IN TWO WEEKS, PLEASE GIVE US  A CALL AT [phone] X [phone].

## 2024-01-24 LAB — HEPATITIS C ANTIBODY: Hep C Virus Ab: NONREACTIVE

## 2024-01-24 LAB — HIV ANTIBODY (ROUTINE TESTING W REFLEX): HIV Screen 4th Generation wRfx: NONREACTIVE

## 2024-01-24 LAB — RPR: RPR Ser Ql: NONREACTIVE

## 2024-01-24 LAB — LIPID PANEL
Chol/HDL Ratio: 5.2 ratio — ABNORMAL HIGH (ref 0.0–5.0)
Cholesterol, Total: 188 mg/dL (ref 100–199)
HDL: 36 mg/dL — ABNORMAL LOW (ref >39)
LDL Chol Calc (NIH): 139 mg/dL — ABNORMAL HIGH (ref 0–99)
Triglycerides: 70 mg/dL (ref 0–149)
VLDL Cholesterol Cal: 13 mg/dL (ref 5–40)

## 2024-01-24 LAB — HSV-2 AB, IGG: HSV 2 IgG, Type Spec: NONREACTIVE

## 2024-01-24 LAB — HEPATITIS B SURFACE ANTIGEN: Hepatitis B Surface Ag: NEGATIVE

## 2024-01-26 LAB — CHLAMYDIA/GONOCOCCUS/TRICHOMONAS, NAA
Chlamydia by NAA: NEGATIVE
Gonococcus by NAA: NEGATIVE
Trich vag by NAA: NEGATIVE

## 2024-02-02 ENCOUNTER — Ambulatory Visit: Payer: Self-pay | Admitting: Nurse Practitioner

## 2024-02-02 DIAGNOSIS — E785 Hyperlipidemia, unspecified: Secondary | ICD-10-CM | POA: Insufficient documentation

## 2024-02-02 NOTE — Assessment & Plan Note (Signed)

## 2024-02-02 NOTE — Assessment & Plan Note (Signed)
 Cholesterol levels were elevated, will recheck. Encouraged to eat low fat diet.

## 2024-02-02 NOTE — Assessment & Plan Note (Signed)
 He is encouraged to strive for BMI less than 30 to decrease cardiac risk. Advised to aim for at least 150 minutes of exercise per week.

## 2024-02-02 NOTE — Assessment & Plan Note (Signed)
 Will check Hepatitis C screening due to recent recommendations to screen all adults 18 years and older

## 2024-07-13 ENCOUNTER — Encounter: Payer: Self-pay | Admitting: Nurse Practitioner

## 2024-07-13 ENCOUNTER — Ambulatory Visit: Payer: Self-pay | Admitting: Nurse Practitioner

## 2024-07-13 VITALS — BP 120/80 | HR 94 | Temp 98.8°F | Ht 69.0 in | Wt 292.4 lb

## 2024-07-13 DIAGNOSIS — Z2821 Immunization not carried out because of patient refusal: Secondary | ICD-10-CM | POA: Diagnosis not present

## 2024-07-13 DIAGNOSIS — Z720 Tobacco use: Secondary | ICD-10-CM | POA: Insufficient documentation

## 2024-07-13 DIAGNOSIS — R062 Wheezing: Secondary | ICD-10-CM | POA: Diagnosis not present

## 2024-07-13 DIAGNOSIS — R21 Rash and other nonspecific skin eruption: Secondary | ICD-10-CM

## 2024-07-13 DIAGNOSIS — Z113 Encounter for screening for infections with a predominantly sexual mode of transmission: Secondary | ICD-10-CM

## 2024-07-13 DIAGNOSIS — Z6841 Body Mass Index (BMI) 40.0 and over, adult: Secondary | ICD-10-CM

## 2024-07-13 DIAGNOSIS — Z139 Encounter for screening, unspecified: Secondary | ICD-10-CM | POA: Diagnosis not present

## 2024-07-13 DIAGNOSIS — Z8669 Personal history of other diseases of the nervous system and sense organs: Secondary | ICD-10-CM

## 2024-07-13 DIAGNOSIS — E782 Mixed hyperlipidemia: Secondary | ICD-10-CM

## 2024-07-13 MED ORDER — MOMETASONE FUROATE 0.1 % EX CREA: 1.0000 | TOPICAL_CREAM | Freq: Every day | CUTANEOUS | 2 refills | Status: AC

## 2024-07-13 MED ORDER — ALBUTEROL SULFATE HFA 108 (90 BASE) MCG/ACT IN AERS: 2.0000 | INHALATION_SPRAY | Freq: Four times a day (QID) | RESPIRATORY_TRACT | 1 refills | Status: AC | PRN

## 2024-07-13 NOTE — Progress Notes (Signed)
 LILLETTE Kristeen JINNY Gladis, CMA,acting as a neurosurgeon for Jorge Ada, FNP.,have documented all relevant documentation on the behalf of Jorge Ada, FNP,as directed by  Jorge Ada, FNP while in the presence of Jorge Ada, FNP.  Subjective:  Patient ID: Jorge Wolfe , male    DOB: 1990/08/30 , 33 y.o.   MRN: [id number]  Chief Complaint  Patient presents with   Hyperlipidemia    Patient presents today for a chol ollow up, Patient reports compliance with medication. Patient denies any chest pain, SOB, or headaches. Patient would like to std testing denies symptoms.    Discussed the use of AI scribe software for clinical note transcription with the patient, who gave verbal consent to proceed.  History of Present Illness Jorge Wolfe is a 33 year old male who presents for a follow-up on his cholesterol levels and a routine STD panel.  He is following up on his cholesterol levels, noting improvement at the last check. He has not been actively limiting fried and fatty foods but is trying to eat more salads and healthier options. 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 He had breakfast consisting of sausage and cheese, which may affect his lab results if done today.  He is seeking a routine STD panel, including tests for HIV and syphilis, as a precaution without any current symptoms. No symptoms related to the STD panel request.  He mentions a past prescription for an ointment for a rash that he never picked up, prescribed back in February.  He has a history of sleep apnea and previously paid out of pocket for a sleep study. He has not been able to retrieve the results from the study and is unsure of the exact details of where it was conducted. He has encountered issues with insurance coverage for another sleep study.  He smokes five to ten cigarettes a day and is not ready to quit at this time.  He is active with towing but does not go to  the gym for exercise. He is eating a regular diet.    Past Medical History:  Diagnosis Date   Asthma      Family History  Problem Relation Age of Onset   Hypertension Mother    Arthritis Father    Heart disease Father    Arrhythmia Father    Hypertension Father    Hyperlipidemia Father      Current Outpatient Medications:    albuterol  (VENTOLIN  HFA) 108 (90 Base) MCG/ACT inhaler, Inhale 2 puffs into the lungs every 6 (six) hours as needed for wheezing or shortness of breath., Disp: 18 each, Rfl: 1   mometasone  (ELOCON ) 0.1 % cream, Apply 1 Application topically daily., Disp: 45 g, Rfl: 2   No Known Allergies   Review of Systems  Constitutional: Negative.   Respiratory: Negative.    Cardiovascular:  Negative for chest pain.  Neurological: Negative.   Psychiatric/Behavioral: Negative.       Today's Vitals   07/13/24 1018  BP: 120/80  Pulse: 94  Temp: 98.8 F (37.1 C)  TempSrc: Oral  Weight: 292 lb 6.4 oz (132.6 kg)  Height: 5' 9 (1.753 m)  PainSc: 0-No pain   Body mass index is 43.18 kg/m.  Wt Readings from Last 3 Encounters:  07/13/24 292 lb 6.4 oz (132.6 kg)  01/23/24 290 lb (131.5 kg)  09/26/23 290 lb 3.2 oz (131.6 kg)      Objective:  Physical Exam Vitals and nursing note reviewed.  Constitutional:      General: He is not in acute distress.    Appearance: Normal appearance. He is obese.  Cardiovascular:     Rate and Rhythm: Normal rate and regular rhythm.     Pulses: Normal pulses.     Heart sounds: Normal heart sounds. No murmur heard. Pulmonary:     Effort: Pulmonary effort is normal. No respiratory distress.     Breath sounds: Normal breath sounds. No wheezing.  Skin:    General: Skin is warm and dry.     Capillary Refill: Capillary refill takes less than 2 seconds.  Neurological:     General: No focal deficit present.     Mental Status: He is alert and oriented to person, place, and time.     Cranial Nerves: No cranial nerve deficit.      Motor: No weakness.  Psychiatric:        Mood and Affect: Mood normal.        Behavior: Behavior normal.        Thought Content: Thought content normal.        Judgment: Judgment normal.     Assessment And Plan:   Assessment & Plan Mixed hyperlipidemia Cholesterol levels were improved at last visit, will check his cholesterol level in am since he has eaten a breakfast sandwich with sausage this morning.  History of sleep apnea Previous sleep study results unavailable. Insurance issues prevent further testing per patient. Needs CPAP but not obtained due to insurance and record issues. I called [facility] and they are planning to contact him and I gave him the phone number.  - Advised contacting insurance to clarify coverage for sleep study. - Encouraged obtaining previous sleep study results from [facility]. Screening for STDs (sexually transmitted diseases)  Influenza vaccination declined  Morbid obesity with BMI of 40.0-44.9, adult (HCC) Weight increased slightly over six months. Lacks structured exercise. - Encouraged 150 minutes of walking per week. Encounter for screening  Scrotal rash Refilled his prescription cream Wheezing No current wheezing however refilled his albuterol  inhaler. Encouraged to quit smoking Tobacco use Smokes 5-10 cigarettes per day. Not ready to quit but aware of risks. - Discussed risks of smoking, including lung cancer and respiratory issues. - Offered support for smoking cessation when ready.  Orders Placed This Encounter  Procedures   Chlamydia/Gonococcus/Trichomonas, NAA   Lipid panel   HIV antibody (with reflex)   RPR   HSV 1 and 2 Ab, IgG   Hepatitis B Surface Antibody     Return for keep same next; lab visit tomorrow.  Patient was given opportunity to ask questions. Patient verbalized understanding of the plan and was able to repeat key elements of the plan. All questions were answered to their satisfaction.    LILLETTE Jorge Ada, FNP, have reviewed all documentation for this visit. The documentation on 07/13/2024 for the exam, diagnosis, procedures, and orders are all accurate and complete.   IF YOU HAVE BEEN REFERRED TO A SPECIALIST, IT MAY TAKE 1-2 WEEKS TO SCHEDULE/PROCESS THE REFERRAL. IF YOU HAVE NOT HEARD FROM US /SPECIALIST IN TWO WEEKS, PLEASE GIVE US  A CALL AT [phone].

## 2024-07-13 NOTE — Assessment & Plan Note (Addendum)
 Refilled his prescription cream

## 2024-07-13 NOTE — Assessment & Plan Note (Addendum)
 Cholesterol levels were improved at last visit, will check his cholesterol level in am since he has eaten a breakfast sandwich with sausage this morning.

## 2024-07-13 NOTE — Assessment & Plan Note (Addendum)
 Previous sleep study results unavailable. Insurance issues prevent further testing per patient. Needs CPAP but not obtained due to insurance and record issues. I called [facility] and they are planning to contact him and I gave him the phone number.  - Advised contacting insurance to clarify coverage for sleep study. - Encouraged obtaining previous sleep study results from [facility].

## 2024-07-13 NOTE — Patient Instructions (Signed)
 Call [facility] at [phone], option 1 for Registration.

## 2024-07-13 NOTE — Assessment & Plan Note (Addendum)
 Smokes 5-10 cigarettes per day. Not ready to quit but aware of risks. - Discussed risks of smoking, including lung cancer and respiratory issues. - Offered support for smoking cessation when ready.

## 2024-07-13 NOTE — Assessment & Plan Note (Addendum)
 No current wheezing however refilled his albuterol  inhaler. Encouraged to quit smoking

## 2024-07-13 NOTE — Assessment & Plan Note (Addendum)
 Weight increased slightly over six months. Lacks structured exercise. - Encouraged 150 minutes of walking per week.

## 2024-07-14 ENCOUNTER — Other Ambulatory Visit: Payer: Self-pay

## 2024-07-15 ENCOUNTER — Ambulatory Visit: Payer: Self-pay | Admitting: Nurse Practitioner

## 2024-07-15 LAB — CHLAMYDIA/GONOCOCCUS/TRICHOMONAS, NAA
Chlamydia by NAA: NEGATIVE
Gonococcus by NAA: NEGATIVE
Trich vag by NAA: NEGATIVE

## 2024-07-15 LAB — LIPID PANEL
Chol/HDL Ratio: 5.8 ratio — ABNORMAL HIGH (ref 0.0–5.0)
Cholesterol, Total: 231 mg/dL — ABNORMAL HIGH (ref 100–199)
HDL: 40 mg/dL (ref >39)
LDL Chol Calc (NIH): 173 mg/dL — ABNORMAL HIGH (ref 0–99)
Triglycerides: 98 mg/dL (ref 0–149)
VLDL Cholesterol Cal: 18 mg/dL (ref 5–40)

## 2024-07-15 LAB — HEPATITIS B SURFACE ANTIBODY,QUALITATIVE: Hep B Surface Ab, Qual: NONREACTIVE

## 2024-07-15 LAB — SYPHILIS: RPR W/REFLEX TO RPR TITER AND TREPONEMAL ANTIBODIES, TRADITIONAL SCREENING AND DIAGNOSIS ALGORITHM: RPR Ser Ql: NONREACTIVE

## 2024-07-15 LAB — HIV ANTIBODY (ROUTINE TESTING W REFLEX): HIV Screen 4th Generation wRfx: NONREACTIVE

## 2024-07-15 LAB — HSV 1 AND 2 AB, IGG
HSV 1 Glycoprotein G Ab, IgG: REACTIVE — AB
HSV 2 IgG, Type Spec: NONREACTIVE

## 2024-07-17 LAB — LIPOPROTEIN A (LPA): Lipoprotein (a): 37.2 nmol/L

## 2024-07-17 LAB — SPECIMEN STATUS REPORT

## 2024-09-28 ENCOUNTER — Encounter: Payer: Self-pay | Admitting: Nurse Practitioner
